# Patient Record
Sex: Female | Born: 1990 | Race: Black or African American | Hispanic: No | Marital: Single | Smoking: Never smoker
Health system: Southern US, Community
[De-identification: ages and names within clinical notes are randomized; demographics above are authoritative.]

## PROBLEM LIST (undated history)

## (undated) DIAGNOSIS — F419 Anxiety disorder, unspecified: Secondary | ICD-10-CM

## (undated) DIAGNOSIS — J45909 Unspecified asthma, uncomplicated: Secondary | ICD-10-CM

## (undated) HISTORY — PX: DILATION AND CURETTAGE OF UTERUS: SHX78

## (undated) HISTORY — DX: Anxiety disorder, unspecified: F41.9

---

## 2015-09-17 ENCOUNTER — Emergency Department (HOSPITAL_COMMUNITY): Payer: Medicaid Other

## 2015-09-17 ENCOUNTER — Encounter (HOSPITAL_COMMUNITY): Payer: Self-pay

## 2015-09-17 ENCOUNTER — Emergency Department (HOSPITAL_COMMUNITY)
Admission: EM | Admit: 2015-09-17 | Discharge: 2015-09-17 | Disposition: A | Discharge status: Home / Self Care | Payer: Medicaid Other | Attending: Emergency Medicine | Admitting: Emergency Medicine

## 2015-09-17 DIAGNOSIS — J4521 Mild intermittent asthma with (acute) exacerbation: Secondary | ICD-10-CM | POA: Diagnosis not present

## 2015-09-17 DIAGNOSIS — H6591 Unspecified nonsuppurative otitis media, right ear: Secondary | ICD-10-CM | POA: Insufficient documentation

## 2015-09-17 DIAGNOSIS — R05 Cough: Secondary | ICD-10-CM | POA: Diagnosis present

## 2015-09-17 HISTORY — DX: Unspecified asthma, uncomplicated: J45.909

## 2015-09-17 MED ORDER — AEROCHAMBER PLUS W/MASK MISC
1.0000 | Freq: Once | Status: AC
  Administered 2015-09-17: 1
  Filled 2015-09-17: qty 1

## 2015-09-17 MED ORDER — AMOXICILLIN 500 MG PO CAPS: 500.0000 mg | ORAL_CAPSULE | Freq: Three times a day (TID) | ORAL | Status: DC

## 2015-09-17 MED ORDER — PREDNISONE 20 MG PO TABS: 40.0000 mg | ORAL_TABLET | Freq: Every day | ORAL | Status: DC

## 2015-09-17 MED ORDER — ALBUTEROL SULFATE HFA 108 (90 BASE) MCG/ACT IN AERS
2.0000 | INHALATION_SPRAY | Freq: Once | RESPIRATORY_TRACT | Status: AC
  Administered 2015-09-17: 2 via RESPIRATORY_TRACT
  Filled 2015-09-17: qty 6.7

## 2015-09-17 NOTE — ED Provider Notes (Signed)
CSN: [id number]     Arrival date & time 09/17/15  1052 History  By signing my name below, I, Freida Busman, attest that this documentation has been prepared under the direction and in the presence of non-physician practitioner, Arthor Captain, PA-C. Electronically Signed: Freida Busman, Scribe. 09/17/2015. 2:04 PM.      Chief Complaint  Patient presents with  . Cough    The history is provided by the patient. No language interpreter was used.     HPI Comments:  Emma Walsh is a 25 y.o. female  who presents to the Emergency Department complaining of cough x ~ 1 month. She notes the cough has been intermittent with  occasional yellow or green sputum production.Her cough is worse at night. She reports associated ear fullness, chills, and sore throat/tightness in her throat after coughing episodes that resolves on its own. Pt has sick contacts at home with similar symptoms- her children. She denies nausea vomiting and diarrhea. No alleviating factors noted. Pt not a smoker.  No PCP  Past Medical History  Diagnosis Date  . Asthma    History reviewed. No pertinent past surgical history. History reviewed. No pertinent family history. Social History  Substance Use Topics  . Smoking status: Never Smoker   . Smokeless tobacco: None  . Alcohol Use: Yes     Comment: social   OB History    No data available     Review of Systems  Constitutional: Positive for chills.  HENT: Positive for ear pain (fullness) and sore throat.   Respiratory: Positive for cough.   Gastrointestinal: Negative for nausea and vomiting.    Allergies  Review of patient's allergies indicates no known allergies.  Home Medications   Prior to Admission medications   Medication Sig Start Date End Date Taking? Authorizing Provider  amoxicillin (AMOXIL) 500 MG capsule Take 1 capsule (500 mg total) by mouth 3 (three) times daily. 09/17/15   Arthor Captain, PA-C  predniSONE (DELTASONE) 20 MG tablet Take 2 tablets (40  mg total) by mouth daily. 09/17/15   Kristoffer Bala, PA-C   BP 131/89 mmHg  Pulse 76  Temp(Src) 98.6 F (37 C) (Oral)  Resp 16  SpO2 98%  LMP 09/13/2015 Physical Exam  Constitutional: She is oriented to person, place, and time. She appears well-developed and well-nourished. No distress.  HENT:  Head: Normocephalic and atraumatic.  Right Ear: Tympanic membrane is bulging (with air fluids).  Left Ear: Tympanic membrane normal.  Mouth/Throat: Oropharynx is clear and moist.  Eyes: Conjunctivae are normal.  Neck:  Tonsillar adenopathy on exam  Cardiovascular: Normal rate, regular rhythm and normal heart sounds.   Pulmonary/Chest: Effort normal and breath sounds normal. No respiratory distress.  lungs clear with dry cough  Abdominal: She exhibits no distension.  Lymphadenopathy:       Head (right side): Tonsillar adenopathy present.       Head (left side): Tonsillar adenopathy present.    She has no cervical adenopathy.  Neurological: She is alert and oriented to person, place, and time.  Skin: Skin is warm and dry.  Psychiatric: She has a normal mood and affect.  Nursing note and vitals reviewed.   ED Course  Procedures   DIAGNOSTIC STUDIES:  Oxygen Saturation is 98% on RA, normal by my interpretation.    COORDINATION OF CARE:  1:49 PM Discussed treatment plan with pt at bedside and pt agreed to plan.  Imaging Review Dg Chest 2 View  09/17/2015  CLINICAL DATA:  Cough for  1 month. EXAM: CHEST  2 VIEW COMPARISON:  None. FINDINGS: Lungs are clear. Heart size and pulmonary vascularity are normal. No adenopathy. No bone lesions. IMPRESSION: No edema or consolidation. Electronically Signed   By: Bretta Bang III M.D.   On: 09/17/2015 12:27   I have personally reviewed and evaluated these images as part of my medical decision-making.    MDM   Final diagnoses:  RAD (reactive airway disease), mild intermittent, with acute exacerbation  OME (otitis media with effusion),  right      Pt symptoms consistent with URI. CXR negative for acute infiltrate. Patient also presents with otalgia and exam consistent with acute otitis media. No concern for acute mastoiditis, meningitis. Pt will be discharged with symptomatic treatment..  Discussed return precautions.   Patient expresses understanding and agrees with plan. Pt is hemodynamically stable & in NAD prior to discharge.  I personally performed the services described in this documentation, which was scribed in my presence. The recorded information has been reviewed and is accurate.       Arthor Captain, PA-C 09/17/15 2043  Gerhard Munch, MD 09/18/15 [phone]

## 2015-09-17 NOTE — Discharge Instructions (Signed)
Cough, Adult Coughing is a reflex that clears your throat and your airways. Coughing helps to heal and protect your lungs. It is normal to cough occasionally, but a cough that happens with other symptoms or lasts a long time may be a sign of a condition that needs treatment. A cough may last only 2-3 weeks (acute), or it may last longer than 8 weeks (chronic). CAUSES Coughing is commonly caused by: 1. Breathing in substances that irritate your lungs. 2. A viral or bacterial respiratory infection. 3. Allergies. 4. Asthma. 5. Postnasal drip. 6. Smoking. 7. Acid backing up from the stomach into the esophagus (gastroesophageal reflux). 8. Certain medicines. 9. Chronic lung problems, including COPD (or rarely, lung cancer). 10. Other medical conditions such as heart failure. HOME CARE INSTRUCTIONS  Pay attention to any changes in your symptoms. Take these actions to help with your discomfort:  Take medicines only as told by your health care provider.  If you were prescribed an antibiotic medicine, take it as told by your health care provider. Do not stop taking the antibiotic even if you start to feel better.  Talk with your health care provider before you take a cough suppressant medicine.  Drink enough fluid to keep your urine clear or pale yellow.  If the air is dry, use a cold steam vaporizer or humidifier in your bedroom or your home to help loosen secretions.  Avoid anything that causes you to cough at work or at home.  If your cough is worse at night, try sleeping in a semi-upright position.  Avoid cigarette smoke. If you smoke, quit smoking. If you need help quitting, ask your health care provider.  Avoid caffeine.  Avoid alcohol.  Rest as needed. SEEK MEDICAL CARE IF:   You have new symptoms.  You cough up pus.  Your cough does not get better after 2-3 weeks, or your cough gets worse.  You cannot control your cough with suppressant medicines and you are losing  sleep.  You develop pain that is getting worse or pain that is not controlled with pain medicines.  You have a fever.  You have unexplained weight loss.  You have night sweats. SEEK IMMEDIATE MEDICAL CARE IF:  You cough up blood.  You have difficulty breathing.  Your heartbeat is very fast.   This information is not intended to replace advice given to you by your health care provider. Make sure you discuss any questions you have with your health care provider.   Document Released: 01/15/2011 Document Revised: 04/09/2015 Document Reviewed: 09/25/2014 Elsevier Interactive Patient Education 2016 [employer].  How to Use an Inhaler Proper inhaler technique is very important. Good technique ensures that the medicine reaches the lungs. Poor technique results in depositing the medicine on the tongue and back of the throat rather than in the airways. If you do not use the inhaler with good technique, the medicine will not help you. STEPS TO FOLLOW IF USING AN INHALER WITHOUT AN EXTENSION TUBE 11. Remove the cap from the inhaler. 12. If you are using the inhaler for the first time, you will need to prime it. Shake the inhaler for 5 seconds and release four puffs into the air, away from your face. Ask your health care provider or pharmacist if you have questions about priming your inhaler. 13. Shake the inhaler for 5 seconds before each breath in (inhalation). 14. Position the inhaler so that the top of the canister faces up. 15. Put your index finger on the  top of the medicine canister. Your thumb supports the bottom of the inhaler. 16. Open your mouth. 17. Either place the inhaler between your teeth and place your lips tightly around the mouthpiece, or hold the inhaler 1-2 inches away from your open mouth. If you are unsure of which technique to use, ask your health care provider. 18. Breathe out (exhale) normally and as completely as possible. 19. Press the canister down with your index  finger to release the medicine. 20. At the same time as the canister is pressed, inhale deeply and slowly until your lungs are completely filled. This should take 4-6 seconds. Keep your tongue down. 21. Hold the medicine in your lungs for 5-10 seconds (10 seconds is best). This helps the medicine get into the small airways of your lungs. 22. Breathe out slowly, through pursed lips. Whistling is an example of pursed lips. 23. Wait at least 15-30 seconds between puffs. Continue with the above steps until you have taken the number of puffs your health care provider has ordered. Do not use the inhaler more than your health care provider tells you. 24. Replace the cap on the inhaler. 25. Follow the directions from your health care provider or the inhaler insert for cleaning the inhaler. STEPS TO FOLLOW IF USING AN INHALER WITH AN EXTENSION (SPACER)  Remove the cap from the inhaler.  If you are using the inhaler for the first time, you will need to prime it. Shake the inhaler for 5 seconds and release four puffs into the air, away from your face. Ask your health care provider or pharmacist if you have questions about priming your inhaler.  Shake the inhaler for 5 seconds before each breath in (inhalation).  Place the open end of the spacer onto the mouthpiece of the inhaler.  Position the inhaler so that the top of the canister faces up and the spacer mouthpiece faces you.  Put your index finger on the top of the medicine canister. Your thumb supports the bottom of the inhaler and the spacer.  Breathe out (exhale) normally and as completely as possible.  Immediately after exhaling, place the spacer between your teeth and into your mouth. Close your lips tightly around the spacer.  Press the canister down with your index finger to release the medicine.  At the same time as the canister is pressed, inhale deeply and slowly until your lungs are completely filled. This should take 4-6 seconds. Keep  your tongue down and out of the way.  Hold the medicine in your lungs for 5-10 seconds (10 seconds is best). This helps the medicine get into the small airways of your lungs. Exhale.  Repeat inhaling deeply through the spacer mouthpiece. Again hold that breath for up to 10 seconds (10 seconds is best). Exhale slowly. If it is difficult to take this second deep breath through the spacer, breathe normally several times through the spacer. Remove the spacer from your mouth.  Wait at least 15-30 seconds between puffs. Continue with the above steps until you have taken the number of puffs your health care provider has ordered. Do not use the inhaler more than your health care provider tells you.  Remove the spacer from the inhaler, and place the cap on the inhaler.  Follow the directions from your health care provider or the inhaler insert for cleaning the inhaler and spacer. If you are using different kinds of inhalers, use your quick relief medicine to open the airways 10-15 minutes before using a  steroid if instructed to do so by your health care provider. If you are unsure which inhalers to use and the order of using them, ask your health care provider, nurse, or respiratory therapist. If you are using a steroid inhaler, always rinse your mouth with water after your last puff, then gargle and spit out the water. Do not swallow the water. AVOID:  Inhaling before or after starting the spray of medicine. It takes practice to coordinate your breathing with triggering the spray.  Inhaling through the nose (rather than the mouth) when triggering the spray. HOW TO DETERMINE IF YOUR INHALER IS FULL OR NEARLY EMPTY You cannot know when an inhaler is empty by shaking it. A few inhalers are now being made with dose counters. Ask your health care provider for a prescription that has a dose counter if you feel you need that extra help. If your inhaler does not have a counter, ask your health care provider to  help you determine the date you need to refill your inhaler. Write the refill date on a calendar or your inhaler canister. Refill your inhaler 7-10 days before it runs out. Be sure to keep an adequate supply of medicine. This includes making sure it is not expired, and that you have a spare inhaler.  SEEK MEDICAL CARE IF:   Your symptoms are only partially relieved with your inhaler.  You are having trouble using your inhaler.  You have some increase in phlegm. SEEK IMMEDIATE MEDICAL CARE IF:   You feel little or no relief with your inhalers. You are still wheezing and are feeling shortness of breath or tightness in your chest or both.  You have dizziness, headaches, or a fast heart rate.  You have chills, fever, or night sweats.  You have a noticeable increase in phlegm production, or there is blood in the phlegm. MAKE SURE YOU:   Understand these instructions.  Will watch your condition.  Will get help right away if you are not doing well or get worse.   This information is not intended to replace advice given to you by your health care provider. Make sure you discuss any questions you have with your health care provider.   Document Released: 07/16/2000 Document Revised: 05/09/2013 Document Reviewed: 02/15/2013 Elsevier Interactive Patient Education 2016 [employer] Inc[employer]  Metered Dose Inhaler (No Spacer Used) Inhaled medicines are the basis of treatment for asthma and other breathing problems. Inhaled medicine can only be effective if used properly. Good technique assures that the medicine reaches the lungs. Metered dose inhalers (MDIs) are used to deliver a variety of inhaled medicines. These include quick relief or rescue medicines (such as bronchodilators) and controller medicines (such as corticosteroids). The medicine is delivered by pushing down on a metal canister to release a set amount of spray. If you are using different kinds of inhalers, use your quick relief medicine to  open the airways 10-15 minutes before using a steroid, if instructed to do so by your health care provider. If you are unsure which inhalers to use and the order of using them, ask your health care provider, nurse, or respiratory therapist. HOW TO USE THE INHALER 26. Remove the cap from the inhaler. 27. If you are using the inhaler for the first time, you will need to prime it. Shake the inhaler for 5 seconds and release four puffs into the air, away from your face. Ask your health care provider or pharmacist if you have questions about priming your inhaler. 28.  Shake the inhaler for 5 seconds before each breath in (inhalation). 29. Position the inhaler so that the top of the canister faces up. 30. Put your index finger on the top of the medicine canister. Your thumb supports the bottom of the inhaler. 31. Open your mouth. 32. Either place the inhaler between your teeth and place your lips tightly around the mouthpiece, or hold the inhaler 1-2 inches away from your open mouth. If you are unsure of which technique to use, ask your health care provider. 33. Breathe out (exhale) normally and as completely as possible. 34. Press the canister down with the index finger to release the medicine. 35. At the same time as the canister is pressed, inhale deeply and slowly until your lungs are completely filled. This should take 4-6 seconds. Keep your tongue down. 36. Hold the medicine in your lungs for 5-10 seconds (10 seconds is best). This helps the medicine get into the small airways of your lungs. 37. Breathe out slowly, through pursed lips. Whistling is an example of pursed lips. 38. Wait at least 1 minute between puffs. Continue with the above steps until you have taken the number of puffs your health care provider has ordered. Do not use the inhaler more than your health care provider directs you to. 39. Replace the cap on the inhaler. 40. Follow the directions from your health care provider or the  inhaler insert for cleaning the inhaler. If you are using a steroid inhaler, after your last puff, rinse your mouth with water, gargle, and spit out the water. Do not swallow the water. AVOID:  Inhaling before or after starting the spray of medicine. It takes practice to coordinate your breathing with triggering the spray.  Inhaling through the nose (rather than the mouth) when triggering the spray. HOW TO DETERMINE IF YOUR INHALER IS FULL OR NEARLY EMPTY You cannot know when an inhaler is empty by shaking it. Some inhalers are now being made with dose counters. Ask your health care provider for a prescription that has a dose counter if you feel you need that extra help. If your inhaler does not have a counter, ask your health care provider to help you determine the date you need to refill your inhaler. Write the refill date on a calendar or your inhaler canister. Refill your inhaler 7-10 days before it runs out. Be sure to keep an adequate supply of medicine. This includes making sure it has not expired, and making sure you have a spare inhaler. SEEK MEDICAL CARE IF:  Symptoms are only partially relieved with your inhaler.  You are having trouble using your inhaler.  You experience an increase in phlegm. SEEK IMMEDIATE MEDICAL CARE IF:  You feel little or no relief with your inhalers. You are still wheezing and feeling shortness of breath, tightness in your chest, or both.  You have dizziness, headaches, or a fast heart rate.  You have chills, fever, or night sweats.  There is a noticeable increase in phlegm production, or there is blood in the phlegm. MAKE SURE YOU:  Understand these instructions.  Will watch your condition.  Will get help right away if you are not doing well or get worse.   This information is not intended to replace advice given to you by your health care provider. Make sure you discuss any questions you have with your health care provider.   Document Released:  05/16/2007 Document Revised: 08/09/2014 Document Reviewed: 01/04/2013 Elsevier Interactive Patient Education [employer]

## 2015-09-17 NOTE — ED Notes (Signed)
Pt with dry cough x 1 month.  Unknown for fever.  Pt states first morning cough is slightly productive but dries throughout the day.

## 2015-11-02 ENCOUNTER — Emergency Department (HOSPITAL_COMMUNITY): Payer: Medicaid Other

## 2015-11-02 ENCOUNTER — Emergency Department (HOSPITAL_COMMUNITY)
Admission: EM | Admit: 2015-11-02 | Discharge: 2015-11-03 | Disposition: A | Discharge status: Home / Self Care | Payer: Medicaid Other | Attending: Emergency Medicine | Admitting: Emergency Medicine

## 2015-11-02 ENCOUNTER — Encounter (HOSPITAL_COMMUNITY): Payer: Self-pay | Admitting: Emergency Medicine

## 2015-11-02 DIAGNOSIS — Y9389 Activity, other specified: Secondary | ICD-10-CM | POA: Diagnosis not present

## 2015-11-02 DIAGNOSIS — Z7952 Long term (current) use of systemic steroids: Secondary | ICD-10-CM | POA: Diagnosis not present

## 2015-11-02 DIAGNOSIS — Z3202 Encounter for pregnancy test, result negative: Secondary | ICD-10-CM | POA: Insufficient documentation

## 2015-11-02 DIAGNOSIS — J45909 Unspecified asthma, uncomplicated: Secondary | ICD-10-CM | POA: Diagnosis not present

## 2015-11-02 DIAGNOSIS — Y92410 Unspecified street and highway as the place of occurrence of the external cause: Secondary | ICD-10-CM | POA: Insufficient documentation

## 2015-11-02 DIAGNOSIS — Y998 Other external cause status: Secondary | ICD-10-CM | POA: Insufficient documentation

## 2015-11-02 DIAGNOSIS — S199XXA Unspecified injury of neck, initial encounter: Secondary | ICD-10-CM | POA: Diagnosis present

## 2015-11-02 DIAGNOSIS — S161XXA Strain of muscle, fascia and tendon at neck level, initial encounter: Secondary | ICD-10-CM | POA: Diagnosis not present

## 2015-11-02 DIAGNOSIS — Z792 Long term (current) use of antibiotics: Secondary | ICD-10-CM | POA: Insufficient documentation

## 2015-11-02 DIAGNOSIS — S39012A Strain of muscle, fascia and tendon of lower back, initial encounter: Secondary | ICD-10-CM | POA: Insufficient documentation

## 2015-11-02 LAB — POC URINE PREG, ED: PREG TEST UR: NEGATIVE

## 2015-11-02 MED ORDER — OXYCODONE-ACETAMINOPHEN 5-325 MG PO TABS
1.0000 | ORAL_TABLET | ORAL | Status: DC | PRN
  Administered 2015-11-02: 1 via ORAL
  Filled 2015-11-02: qty 1
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
## 2015-11-02 NOTE — ED Notes (Signed)
11/02/2015 Pain medication given in Triage. Patient advised about side effects of medications and  to avoid driving for a minimum of 4 hours.
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
## 2015-11-03 MED ORDER — TRAMADOL HCL 50 MG PO TABS: 50.0000 mg | ORAL_TABLET | Freq: Four times a day (QID) | ORAL | Status: DC | PRN

## 2015-11-03 MED ORDER — IBUPROFEN 800 MG PO TABS: 800.0000 mg | ORAL_TABLET | Freq: Three times a day (TID) | ORAL | Status: DC | PRN

## 2015-11-03 NOTE — Discharge Instructions (Signed)
Return here as needed.  Use ice and heat on your neck and back.  The x-rays did not show any abnormalities

## 2016-05-14 ENCOUNTER — Emergency Department (HOSPITAL_COMMUNITY)
Admission: EM | Admit: 2016-05-14 | Discharge: 2016-05-14 | Disposition: A | Discharge status: Home / Self Care | Payer: Medicaid Other | Attending: Emergency Medicine | Admitting: Emergency Medicine

## 2016-05-14 ENCOUNTER — Encounter (HOSPITAL_COMMUNITY): Payer: Self-pay | Admitting: Emergency Medicine

## 2016-05-14 ENCOUNTER — Emergency Department (HOSPITAL_COMMUNITY): Payer: Self-pay

## 2016-05-14 DIAGNOSIS — J40 Bronchitis, not specified as acute or chronic: Secondary | ICD-10-CM | POA: Insufficient documentation

## 2016-05-14 DIAGNOSIS — Z791 Long term (current) use of non-steroidal anti-inflammatories (NSAID): Secondary | ICD-10-CM | POA: Insufficient documentation

## 2016-05-14 DIAGNOSIS — J069 Acute upper respiratory infection, unspecified: Secondary | ICD-10-CM

## 2016-05-14 MED ORDER — ALBUTEROL SULFATE HFA 108 (90 BASE) MCG/ACT IN AERS: 1.0000 | INHALATION_SPRAY | RESPIRATORY_TRACT | 0 refills | Status: DC | PRN

## 2016-05-14 MED ORDER — ALBUTEROL SULFATE (2.5 MG/3ML) 0.083% IN NEBU
2.5000 mg | INHALATION_SOLUTION | Freq: Once | RESPIRATORY_TRACT | Status: AC
  Administered 2016-05-14: 2.5 mg via RESPIRATORY_TRACT
  Filled 2016-05-14: qty 3

## 2016-05-14 MED ORDER — PREDNISONE 20 MG PO TABS: 40.0000 mg | ORAL_TABLET | Freq: Every day | ORAL | 0 refills | Status: DC

## 2016-05-14 MED ORDER — PREDNISONE 20 MG PO TABS
60.0000 mg | ORAL_TABLET | Freq: Once | ORAL | Status: AC
  Administered 2016-05-14: 60 mg via ORAL
  Filled 2016-05-14: qty 3

## 2016-05-14 MED ORDER — PROMETHAZINE-DM 6.25-15 MG/5ML PO SYRP: 5.0000 mL | ORAL_SOLUTION | Freq: Four times a day (QID) | ORAL | 0 refills | Status: DC | PRN

## 2016-05-14 MED ORDER — AZITHROMYCIN 250 MG PO TABS: ORAL_TABLET | ORAL | 0 refills | Status: DC

## 2016-05-14 MED ORDER — IPRATROPIUM-ALBUTEROL 0.5-2.5 (3) MG/3ML IN SOLN
3.0000 mL | Freq: Once | RESPIRATORY_TRACT | Status: AC
  Administered 2016-05-14: 3 mL via RESPIRATORY_TRACT
  Filled 2016-05-14: qty 3

## 2016-05-14 NOTE — Progress Notes (Addendum)
Pt has been coughing times several days. Breathing treatment given.pt taken to the x-ray dept.

## 2016-05-14 NOTE — Discharge Instructions (Signed)
Take medication as prescribed. Drink plenty of water to stay hydrated. Return to the ER for new or worsening symptoms.

## 2016-05-14 NOTE — ED Provider Notes (Signed)
[facility]-EMERGENCY DEPT Provider Note   CSN: [id number] Arrival date & time: 05/14/16  1033  By signing my name below, I, Clovis Pu, attest that this documentation has been prepared under the direction and in the presence of  Mariachristina Holle, [place]. Electronically Signed: Clovis Pu, ED Scribe. 05/14/16. 3:02 PM.   History   Chief Complaint Chief Complaint  Patient presents with  . URI    The history is provided by the patient. No language interpreter was used.   HPI Comments:  Emma Walsh is a 25 y.o. female, with a hx of asthmatic bronchitis, who presents to the Emergency Department complaining of cold symptoms x 2 weeks. She notes associated occasional blood tinged mucous, rhinorrhea, sore throat, congestion, chills. Pt also states she has swelling to the R side of her neck.  Pt has taken mucinex cough and inhaler three times a day with no relief. She denies difficulty swallowing and fevers. Denies night sweats or unexpected weight loss.  Past Medical History:  Diagnosis Date  . Asthma     There are no active problems to display for this patient.   History reviewed. No pertinent surgical history.  OB History    No data available       Home Medications    Prior to Admission medications   Medication Sig Start Date End Date Taking? Authorizing Provider  amoxicillin (AMOXIL) 500 MG capsule Take 1 capsule (500 mg total) by mouth 3 (three) times daily. 09/17/15   Arthor Captain, PA-C  ibuprofen (ADVIL,MOTRIN) 800 MG tablet Take 1 tablet (800 mg total) by mouth every 8 (eight) hours as needed. 11/03/15   Charlestine Night, PA-C  predniSONE (DELTASONE) 20 MG tablet Take 2 tablets (40 mg total) by mouth daily. 09/17/15   Arthor Captain, PA-C  traMADol (ULTRAM) 50 MG tablet Take 1 tablet (50 mg total) by mouth every 6 (six) hours as needed for severe pain. 11/03/15   Charlestine Night, PA-C    Family History No family history on file.  Social History Social History  Substance Use  Topics  . Smoking status: Never Smoker  . Smokeless tobacco: Never Used  . Alcohol use Yes     Comment: social     Allergies   Review of patient's allergies indicates no known allergies.   Review of Systems Review of Systems  Constitutional: Positive for chills. Negative for fever.  HENT: Positive for congestion, rhinorrhea and sore throat. Negative for trouble swallowing.   Respiratory: Positive for cough.    10 Systems reviewed and are negative for acute change except as noted in the HPI.  Physical Exam Updated Vital Signs LMP 05/03/2016   Physical Exam  Constitutional: She is oriented to person, place, and time. She appears well-developed and well-nourished. No distress.  HENT:  Head: Normocephalic and atraumatic.  Right Ear: External ear normal.  Left Ear: External ear normal.  Nose: Nose normal.  TM NL bilaterally  Eyes: Conjunctivae are normal.  Neck:  Right anterior cervical lymphadenopathy   Cardiovascular: Normal rate and normal heart sounds.   Pulmonary/Chest: Effort normal. She has wheezes. She has no rales.  Soft expiratory wheezing in all lung fields. No rhonchi.   Abdominal: She exhibits no distension.  Neurological: She is alert and oriented to person, place, and time.  Skin: Skin is warm and dry.  Psychiatric: She has a normal mood and affect.  Nursing note and vitals reviewed.    ED Treatments / Results  DIAGNOSTIC STUDIES:  Oxygen Saturation is 100%  on RA, normal by my interpretation.    COORDINATION OF CARE:  1:19 PM Discussed treatment plan with pt at bedside and pt agreed to plan.  Labs (all labs ordered are listed, but only abnormal results are displayed) Labs Reviewed - No data to display  EKG  EKG Interpretation None       Radiology Dg Chest 2 View  Result Date: 05/14/2016 CLINICAL DATA:  Cough, 3 weeks duration.  History of bronchitis. EXAM: CHEST  2 VIEW COMPARISON:  09/17/2015 FINDINGS: Heart size is normal. Mediastinal  shadows are normal. The lungs are clear. No bronchial thickening. No infiltrate, mass, effusion or collapse. Pulmonary vascularity is normal. No bony abnormality. IMPRESSION: Normal chest. Electronically Signed   By: Paulina FusiMark  Shogry M.D.   On: 05/14/2016 14:05    Procedures Procedures (including critical care time)  Medications Ordered in ED Medications - No data to display   Initial Impression / Assessment and Plan / ED Course  I have reviewed the triage vital signs and the nursing notes.  Pertinent labs & imaging results that were available during my care of the patient were reviewed by me and considered in my medical decision making (see chart for details).  Clinical Course    Pt symptoms consistent with URI. CXR negative for acute infiltrate. Lungs CTAB after duoneb. Her cervical LAD is likely related to URI. No supraclavicular lymphadenopathy, night sweats, unexplained weight loss. Pt will be discharged with symptomatic treatment. As she is between insurance and primary care providers I did provide rx for z-pack for her to take if symptoms persist after the weekend. Discussed return precautions.  Pt is hemodynamically stable & in NAD prior to discharge.   Final Clinical Impressions(s) / ED Diagnoses   Final diagnoses:  Upper respiratory tract infection, unspecified type  Bronchitis    New Prescriptions Discharge Medication List as of 05/14/2016  3:13 PM    START taking these medications   Details  albuterol (PROVENTIL HFA;VENTOLIN HFA) 108 (90 Base) MCG/ACT inhaler Inhale 1-2 puffs into the lungs every 4 (four) hours as needed for wheezing or shortness of breath., Starting Fri 05/14/2016, Print    azithromycin (ZITHROMAX) 250 MG tablet Take first 2 tablets together, then 1 every day until finished., Print    !! predniSONE (DELTASONE) 20 MG tablet Take 2 tablets (40 mg total) by mouth daily., Starting Fri 05/14/2016, Print    promethazine-dextromethorphan (PROMETHAZINE-DM)  6.25-15 MG/5ML syrup Take 5 mLs by mouth 4 (four) times daily as needed for cough., Starting Fri 05/14/2016, Print     !! - Potential duplicate medications found. Please discuss with provider.    I personally performed the services described in this documentation, which was scribed in my presence. The recorded information has been reviewed and is accurate.    Carlene CoriaSerena Y Kenith Trickel, PA-C 05/14/16 1523    Nira ConnPedro Eduardo Cardama, MD 05/14/16 [phone]

## 2016-05-14 NOTE — ED Triage Notes (Signed)
Per pt, states cold symptoms for 2 weeks-cough, chest congestion-mucus yellow, and sometimes blood tinged

## 2016-05-26 LAB — OB RESULTS CONSOLE GBS: GBS: POSITIVE

## 2016-08-02 NOTE — L&D Delivery Note (Signed)
Delivery Note Pt received epidural but did not get much relief as she progressed quickly to complete and pushed well.  At 1:32 PM a viable female was delivered via  (Presentation: vtx; ROA ).  APGAR: 8, 9; weight pending.   Placenta status: spontaneous, intact.  Cord:  with the following complications: none.   Anesthesia: Epidural  Episiotomy: None Lacerations: Labial Suture Repair: none Est. Blood Loss (mL):  200  Mom to postpartum.  Baby to Couplet care / Skin to Skin.  She does not want the baby circumcised.  Leighton Roachodd D Naseer Hearn 06/21/2017, 1:44 PM

## 2016-12-28 LAB — OB RESULTS CONSOLE HEPATITIS B SURFACE ANTIGEN: Hepatitis B Surface Ag: NEGATIVE

## 2016-12-28 LAB — OB RESULTS CONSOLE RUBELLA ANTIBODY, IGM: Rubella: IMMUNE

## 2016-12-28 LAB — OB RESULTS CONSOLE ABO/RH: RH Type: POSITIVE

## 2016-12-28 LAB — OB RESULTS CONSOLE GC/CHLAMYDIA
Chlamydia: NEGATIVE
Gonorrhea: NEGATIVE

## 2016-12-28 LAB — OB RESULTS CONSOLE ANTIBODY SCREEN: Antibody Screen: NEGATIVE

## 2016-12-28 LAB — OB RESULTS CONSOLE HIV ANTIBODY (ROUTINE TESTING): HIV: NONREACTIVE

## 2016-12-28 LAB — OB RESULTS CONSOLE RPR: RPR: NONREACTIVE

## 2017-05-26 LAB — OB RESULTS CONSOLE GBS: STREP GROUP B AG: POSITIVE

## 2017-06-14 ENCOUNTER — Telehealth (HOSPITAL_COMMUNITY): Payer: Self-pay | Admitting: *Deleted

## 2017-06-14 ENCOUNTER — Encounter (HOSPITAL_COMMUNITY): Payer: Self-pay | Admitting: *Deleted

## 2017-06-14 NOTE — Telephone Encounter (Signed)
Preadmission screen  

## 2017-06-21 ENCOUNTER — Inpatient Hospital Stay (HOSPITAL_COMMUNITY)
Admission: RE | Admit: 2017-06-21 | Discharge: 2017-06-22 | DRG: 807 | Disposition: A | Discharge status: Home / Self Care | Payer: Medicaid Other | Source: Ambulatory Visit | Attending: Obstetrics and Gynecology | Admitting: Obstetrics and Gynecology

## 2017-06-21 ENCOUNTER — Inpatient Hospital Stay (HOSPITAL_COMMUNITY): Payer: Medicaid Other | Admitting: Anesthesiology

## 2017-06-21 ENCOUNTER — Encounter (HOSPITAL_COMMUNITY): Payer: Self-pay | Admitting: Anesthesiology

## 2017-06-21 ENCOUNTER — Encounter (HOSPITAL_COMMUNITY): Payer: Self-pay

## 2017-06-21 DIAGNOSIS — O99824 Streptococcus B carrier state complicating childbirth: Secondary | ICD-10-CM | POA: Diagnosis present

## 2017-06-21 DIAGNOSIS — Z3A40 40 weeks gestation of pregnancy: Secondary | ICD-10-CM

## 2017-06-21 DIAGNOSIS — O26893 Other specified pregnancy related conditions, third trimester: Secondary | ICD-10-CM | POA: Diagnosis present

## 2017-06-21 LAB — CBC
HCT: 35.7 % — ABNORMAL LOW (ref 36.0–46.0)
Hemoglobin: 11.7 g/dL — ABNORMAL LOW (ref 12.0–15.0)
MCH: 30.4 pg (ref 26.0–34.0)
MCHC: 32.8 g/dL (ref 30.0–36.0)
MCV: 92.7 fL (ref 78.0–100.0)
PLATELETS: 201 10*3/uL (ref 150–400)
RBC: 3.85 MIL/uL — AB (ref 3.87–5.11)
RDW: 14.2 % (ref 11.5–15.5)
WBC: 10.4 10*3/uL (ref 4.0–10.5)

## 2017-06-21 LAB — TYPE AND SCREEN
ABO/RH(D): O POS
Antibody Screen: NEGATIVE

## 2017-06-21 LAB — ABO/RH: ABO/RH(D): O POS

## 2017-06-21 MED ORDER — SIMETHICONE 80 MG PO CHEW: 80.0000 mg | CHEWABLE_TABLET | ORAL | Status: DC | PRN

## 2017-06-21 MED ORDER — DIPHENHYDRAMINE HCL 25 MG PO CAPS: 25.0000 mg | ORAL_CAPSULE | Freq: Four times a day (QID) | ORAL | Status: DC | PRN

## 2017-06-21 MED ORDER — LIDOCAINE HCL (PF) 1 % IJ SOLN
30.0000 mL | INTRAMUSCULAR | Status: AC | PRN
  Administered 2017-06-21: 7 mL via SUBCUTANEOUS
  Administered 2017-06-21: 5 mL via SUBCUTANEOUS

## 2017-06-21 MED ORDER — OXYCODONE-ACETAMINOPHEN 5-325 MG PO TABS
2.0000 | ORAL_TABLET | ORAL | Status: DC | PRN
Start: 2017-06-21 — End: 2017-06-21

## 2017-06-21 MED ORDER — PHENYLEPHRINE 40 MCG/ML (10ML) SYRINGE FOR IV PUSH (FOR BLOOD PRESSURE SUPPORT)
80.0000 ug | PREFILLED_SYRINGE | INTRAVENOUS | Status: DC | PRN
  Filled 2017-06-21: qty 5

## 2017-06-21 MED ORDER — COCONUT OIL OIL: 1.0000 "application " | TOPICAL_OIL | Status: DC | PRN

## 2017-06-21 MED ORDER — ONDANSETRON HCL 4 MG/2ML IJ SOLN: 4.0000 mg | Freq: Four times a day (QID) | INTRAMUSCULAR | Status: DC | PRN

## 2017-06-21 MED ORDER — OXYCODONE HCL 5 MG PO TABS: 10.0000 mg | ORAL_TABLET | ORAL | Status: DC | PRN

## 2017-06-21 MED ORDER — WITCH HAZEL-GLYCERIN EX PADS: 1.0000 "application " | MEDICATED_PAD | CUTANEOUS | Status: DC | PRN

## 2017-06-21 MED ORDER — LACTATED RINGERS IV SOLN: 500.0000 mL | INTRAVENOUS | Status: DC | PRN

## 2017-06-21 MED ORDER — BUTORPHANOL TARTRATE 1 MG/ML IJ SOLN: 1.0000 mg | INTRAMUSCULAR | Status: DC | PRN

## 2017-06-21 MED ORDER — BENZOCAINE-MENTHOL 20-0.5 % EX AERO: 1.0000 "application " | INHALATION_SPRAY | CUTANEOUS | Status: DC | PRN

## 2017-06-21 MED ORDER — MEASLES, MUMPS & RUBELLA VAC ~~LOC~~ INJ
0.5000 mL | INJECTION | Freq: Once | SUBCUTANEOUS | Status: DC
  Filled 2017-06-21: qty 0.5

## 2017-06-21 MED ORDER — ONDANSETRON HCL 4 MG PO TABS: 4.0000 mg | ORAL_TABLET | ORAL | Status: DC | PRN

## 2017-06-21 MED ORDER — PHENYLEPHRINE 40 MCG/ML (10ML) SYRINGE FOR IV PUSH (FOR BLOOD PRESSURE SUPPORT)
80.0000 ug | PREFILLED_SYRINGE | INTRAVENOUS | Status: DC | PRN
  Filled 2017-06-21: qty 10
  Filled 2017-06-21: qty 5

## 2017-06-21 MED ORDER — PENICILLIN G POTASSIUM 5000000 UNITS IJ SOLR
5.0000 10*6.[IU] | Freq: Once | INTRAVENOUS | Status: AC
  Administered 2017-06-21: 5 10*6.[IU] via INTRAVENOUS
  Filled 2017-06-21: qty 5

## 2017-06-21 MED ORDER — DIPHENHYDRAMINE HCL 50 MG/ML IJ SOLN: 12.5000 mg | INTRAMUSCULAR | Status: DC | PRN

## 2017-06-21 MED ORDER — DIBUCAINE 1 % RE OINT: 1.0000 "application " | TOPICAL_OINTMENT | RECTAL | Status: DC | PRN

## 2017-06-21 MED ORDER — OXYCODONE HCL 5 MG PO TABS: 5.0000 mg | ORAL_TABLET | ORAL | Status: DC | PRN

## 2017-06-21 MED ORDER — MAGNESIUM HYDROXIDE 400 MG/5ML PO SUSP: 30.0000 mL | ORAL | Status: DC | PRN

## 2017-06-21 MED ORDER — ACETAMINOPHEN 325 MG PO TABS: 650.0000 mg | ORAL_TABLET | ORAL | Status: DC | PRN

## 2017-06-21 MED ORDER — TERBUTALINE SULFATE 1 MG/ML IJ SOLN
0.2500 mg | Freq: Once | INTRAMUSCULAR | Status: DC | PRN
  Filled 2017-06-21: qty 1

## 2017-06-21 MED ORDER — FENTANYL 2.5 MCG/ML BUPIVACAINE 1/10 % EPIDURAL INFUSION (WH - ANES)
14.0000 mL/h | INTRAMUSCULAR | Status: DC | PRN
  Administered 2017-06-21: 14 mL/h via EPIDURAL
  Filled 2017-06-21: qty 100

## 2017-06-21 MED ORDER — SENNOSIDES-DOCUSATE SODIUM 8.6-50 MG PO TABS
2.0000 | ORAL_TABLET | ORAL | Status: DC
  Administered 2017-06-21: 2 via ORAL
  Filled 2017-06-21: qty 2

## 2017-06-21 MED ORDER — LACTATED RINGERS IV SOLN
500.0000 mL | Freq: Once | INTRAVENOUS | Status: AC
  Administered 2017-06-21: 500 mL via INTRAVENOUS

## 2017-06-21 MED ORDER — ZOLPIDEM TARTRATE 5 MG PO TABS: 5.0000 mg | ORAL_TABLET | Freq: Every evening | ORAL | Status: DC | PRN

## 2017-06-21 MED ORDER — SOD CITRATE-CITRIC ACID 500-334 MG/5ML PO SOLN
30.0000 mL | ORAL | Status: DC | PRN
  Administered 2017-06-21: 30 mL via ORAL
  Filled 2017-06-21: qty 15

## 2017-06-21 MED ORDER — OXYTOCIN BOLUS FROM INFUSION
500.0000 mL | Freq: Once | INTRAVENOUS | Status: AC
  Administered 2017-06-21: 500 mL via INTRAVENOUS

## 2017-06-21 MED ORDER — EPHEDRINE 5 MG/ML INJ
10.0000 mg | INTRAVENOUS | Status: DC | PRN
  Filled 2017-06-21: qty 2

## 2017-06-21 MED ORDER — OXYCODONE-ACETAMINOPHEN 5-325 MG PO TABS: 1.0000 | ORAL_TABLET | ORAL | Status: DC | PRN

## 2017-06-21 MED ORDER — IBUPROFEN 600 MG PO TABS
600.0000 mg | ORAL_TABLET | Freq: Four times a day (QID) | ORAL | Status: DC
  Administered 2017-06-21 – 2017-06-22 (×4): 600 mg via ORAL
  Filled 2017-06-21 (×4): qty 1

## 2017-06-21 MED ORDER — OXYTOCIN 40 UNITS IN LACTATED RINGERS INFUSION - SIMPLE MED
1.0000 m[IU]/min | INTRAVENOUS | Status: DC
  Administered 2017-06-21: 2 m[IU]/min via INTRAVENOUS
  Filled 2017-06-21: qty 1000

## 2017-06-21 MED ORDER — PRENATAL MULTIVITAMIN CH
1.0000 | ORAL_TABLET | Freq: Every day | ORAL | Status: DC
  Administered 2017-06-22: 1 via ORAL
  Filled 2017-06-21: qty 1

## 2017-06-21 MED ORDER — TETANUS-DIPHTH-ACELL PERTUSSIS 5-2.5-18.5 LF-MCG/0.5 IM SUSP: 0.5000 mL | Freq: Once | INTRAMUSCULAR | Status: DC

## 2017-06-21 MED ORDER — ONDANSETRON HCL 4 MG/2ML IJ SOLN: 4.0000 mg | INTRAMUSCULAR | Status: DC | PRN

## 2017-06-21 MED ORDER — LACTATED RINGERS IV SOLN
INTRAVENOUS | Status: DC
  Administered 2017-06-21: 09:00:00 via INTRAVENOUS

## 2017-06-21 MED ORDER — PENICILLIN G POT IN DEXTROSE 60000 UNIT/ML IV SOLN
3.0000 10*6.[IU] | INTRAVENOUS | Status: DC
  Administered 2017-06-21: 3 10*6.[IU] via INTRAVENOUS
  Filled 2017-06-21 (×4): qty 50

## 2017-06-21 MED ORDER — METHYLERGONOVINE MALEATE 0.2 MG/ML IJ SOLN: 0.2000 mg | INTRAMUSCULAR | Status: DC | PRN

## 2017-06-21 MED ORDER — OXYTOCIN 40 UNITS IN LACTATED RINGERS INFUSION - SIMPLE MED
2.5000 [IU]/h | INTRAVENOUS | Status: DC
  Administered 2017-06-21: 2.5 [IU]/h via INTRAVENOUS

## 2017-06-21 MED ORDER — METHYLERGONOVINE MALEATE 0.2 MG PO TABS: 0.2000 mg | ORAL_TABLET | ORAL | Status: DC | PRN

## 2017-06-21 NOTE — Lactation Note (Signed)
This note was copied from a baby's chart. Lactation Consultation Note  Patient Name: Emma Walsh ZOXWR'UToday's Date: 06/21/2017 Reason for consult: Initial assessment Baby at 8 hr of life. Upon entry baby was latched in cradle to L breast. Mom reports baby "stuggles for a few minutes to latch but when he gets it, we are good". She denies breast or nipple pain, voiced no concerns. Discussed baby behavior, feeding frequency, baby belly size, voids, wt loss, breast changes, and nipple care. Mom stated she can manually express, has spoon at the bedside. Given lactation handouts. Aware of OP services and support group.    Maternal Data Has patient been taught Hand Expression?: Yes Does the patient have breastfeeding experience prior to this delivery?: Yes  Feeding Feeding Type: Breast Fed Length of feed: 15 min  LATCH Score Latch: Repeated attempts needed to sustain latch, nipple held in mouth throughout feeding, stimulation needed to elicit sucking reflex.(per mom)  Audible Swallowing: A few with stimulation  Type of Nipple: Everted at rest and after stimulation  Comfort (Breast/Nipple): Soft / non-tender  Hold (Positioning): No assistance needed to correctly position infant at breast.(per mom)  LATCH Score: 8  Interventions Interventions: Breast feeding basics reviewed;Skin to skin;Breast massage;Hand express;Breast compression;Support pillows;Position options;DEBP  Lactation Tools Discussed/Used WIC Program: No   Consult Status Consult Status: Follow-up Date: 06/22/17 Follow-up type: In-patient    [name] 06/21/2017, 9:56 PM

## 2017-06-21 NOTE — Progress Notes (Signed)
Feeling ctx, ready to get epidural Afeb, VSS FHT- Cat II, 120-130, mod variability, + accels, some variable decels VE-5/80/-1, vtx, AROM clear Continue pitocin and PCN, anticipate SVD

## 2017-06-21 NOTE — Anesthesia Postprocedure Evaluation (Signed)
Anesthesia Post Note  Patient: Emma Walsh  Procedure(s) Performed: AN AD HOC LABOR EPIDURAL     Patient location during evaluation: Mother Baby Anesthesia Type: Epidural Level of consciousness: awake and alert and oriented Pain management: satisfactory to patient Vital Signs Assessment: post-procedure vital signs reviewed and stable Respiratory status: respiratory function stable Cardiovascular status: stable Postop Assessment: no headache, no backache, epidural receding, patient able to bend at knees, no signs of nausea or vomiting and adequate PO intake Anesthetic complications: no    Last Vitals:  Vitals:   06/21/17 1530 06/21/17 1630  BP: 125/62 (!) 114/59  Pulse: 80 84  Resp: 18 18  Temp: 36.8 C 37.1 C  SpO2:      Last Pain:  Vitals:   06/21/17 1711  TempSrc:   PainSc: 1    Pain Goal:                 Jeremiah Curci

## 2017-06-21 NOTE — Anesthesia Procedure Notes (Signed)
Epidural Patient location during procedure: OB Start time: 06/21/2017 1:04 PM End time: 06/21/2017 1:08 PM  Staffing Anesthesiologist: Leilani AbleHatchett, Lakayla Barrington, MD Performed: anesthesiologist   Preanesthetic Checklist Completed: patient identified, site marked, surgical consent, pre-op evaluation, timeout performed, IV checked, risks and benefits discussed and monitors and equipment checked  Epidural Patient position: sitting Prep: site prepped and draped and DuraPrep Patient monitoring: continuous pulse ox and blood pressure Approach: midline Location: L3-L4 Injection technique: LOR air  Needle:  Needle type: Tuohy  Needle gauge: 17 G Needle length: 9 cm and 9 Needle insertion depth: 6 cm Catheter type: closed end flexible Catheter size: 19 Gauge Catheter at skin depth: 11 cm Test dose: negative and Other  Assessment Sensory level: T9 Events: blood not aspirated, injection not painful, no injection resistance, negative IV test and no paresthesia  Additional Notes Reason for block:procedure for pain

## 2017-06-21 NOTE — Anesthesia Preprocedure Evaluation (Signed)
Anesthesia Evaluation  Patient identified by MRN, date of birth, ID band Patient awake    Reviewed: Allergy & Precautions, H&P , NPO status , Patient's Chart, lab work & pertinent test results  Airway Mallampati: I  TM Distance: >3 FB Neck ROM: full    Dental no notable dental hx.    Pulmonary    Pulmonary exam normal breath sounds clear to auscultation       Cardiovascular negative cardio ROS Normal cardiovascular exam Rhythm:regular Rate:Normal     Neuro/Psych negative neurological ROS  negative psych ROS   GI/Hepatic negative GI ROS, Neg liver ROS,   Endo/Other  negative endocrine ROS  Renal/GU negative Renal ROS     Musculoskeletal   Abdominal Normal abdominal exam  (+)   Peds  Hematology negative hematology ROS (+)   Anesthesia Other Findings   Reproductive/Obstetrics (+) Pregnancy                             Anesthesia Physical Anesthesia Plan  ASA: II  Anesthesia Plan: Epidural   Post-op Pain Management:    Induction:   PONV Risk Score and Plan:   Airway Management Planned:   Additional Equipment:   Intra-op Plan:   Post-operative Plan:   Informed Consent: I have reviewed the patients History and Physical, chart, labs and discussed the procedure including the risks, benefits and alternatives for the proposed anesthesia with the patient or authorized representative who has indicated his/her understanding and acceptance.     Plan Discussed with:   Anesthesia Plan Comments:         Anesthesia Quick Evaluation

## 2017-06-21 NOTE — H&P (Signed)
Emma Walsh is a 26 y.o. female, G5 [id number], [facility]red[facility] for [facility] induction with favorable cervix.  Late prenatal care at 14-15 weeks, otherwise uncomplicated.  OB History    Gravida Para Term Preterm AB Living   5 2 2   2 2    SAB TAB Ectopic Multiple Live Births     2     2    SVD at term x 2, no complications  Past Medical History:  Diagnosis Date  . Anxiety    2016, When grandmother passed  . Asthma    Past Surgical History:  Procedure Laterality Date  . DILATION AND CURETTAGE OF UTERUS     Family History: family history includes Heart disease in her paternal grandmother. Social History:  reports that  has never smoked. she has never used smokeless tobacco. She reports that she drinks alcohol. She reports that she does not use drugs.     Maternal Diabetes: No Genetic Screening: Declined Maternal Ultrasounds/Referrals: Normal Fetal Ultrasounds or other Referrals:  None Maternal Substance Abuse:  No Significant Maternal Medications:  None Significant Maternal Lab Results:  Lab values include: Group B Strep positive Other Comments:  None  Review of Systems  Respiratory: Negative.   Cardiovascular: Negative.    Maternal Medical History:  Fetal activity: Perceived fetal activity is normal.    Prenatal complications: no prenatal complications Prenatal Complications - Diabetes: none.      Blood pressure 123/78, pulse 95, temperature 98.7 F (37.1 C), temperature source Oral, resp. rate 16, height 5\' 8"  (1.727 m), weight 224 lb (101.6 kg), last menstrual period 09/14/2016. Maternal Exam:  Uterine Assessment: Contraction strength is mild.  Contraction frequency is irregular.   Abdomen: Patient reports no abdominal tenderness. Estimated fetal weight is 7 1/2 lbs.   Fetal presentation: vertex  Introitus: Normal vulva. Normal vagina.  Amniotic fluid character: not assessed.  Pelvis: adequate for delivery.   Cervix: Cervix evaluated by  digital exam.     Fetal Exam Fetal Monitor Review: Mode: ultrasound.   Baseline rate: 120.  Variability: moderate (6-25 bpm).   Pattern: accelerations present and no decelerations.    Fetal State Assessment: Category I - tracings are normal.     Physical Exam  Vitals reviewed. Constitutional: She appears well-developed and well-nourished.  Cardiovascular: Normal rate and regular rhythm.  Respiratory: Effort normal. No respiratory distress.  GI: Soft.    Prenatal labs: ABO, Rh: O/Positive/-- (05/29 0000) Antibody: Negative (05/29 0000) Rubella: Immune (05/29 0000) RPR: Nonreactive (05/29 0000)  HBsAg: Negative (05/29 0000)  HIV: Non-reactive (05/29 0000)  GBS: Positive (10/25 0000)   Assessment/Plan: IUP at 40 weeks for elective induction, +GBS.  Will start pitocin and PCN, monitor progress, anticipate SVD.  Risks of pitocin previously discussed.   Leighton Roachodd D Talajah Slimp 06/21/2017, 8:39 AM

## 2017-06-22 LAB — RPR: RPR: NONREACTIVE

## 2017-06-22 MED ORDER — IBUPROFEN 600 MG PO TABS: 600.0000 mg | ORAL_TABLET | Freq: Four times a day (QID) | ORAL | 1 refills | Status: AC | PRN

## 2017-06-22 MED ORDER — PRENATAL MULTIVITAMIN CH: 1.0000 | ORAL_TABLET | Freq: Every day | ORAL | 3 refills | Status: AC

## 2017-06-22 NOTE — Progress Notes (Addendum)
Post Partum Day 1 Subjective: no complaints, up ad lib, voiding, tolerating PO and nl lochia, pain controlled  Objective: Blood pressure 123/83, pulse 83, temperature 98.2 F (36.8 C), temperature source Oral, resp. rate 16, height 5\' 8"  (1.727 m), weight 101.6 kg (224 lb), last menstrual period 09/14/2016, SpO2 99 %, unknown if currently breastfeeding.  Physical Exam:  General: alert and no distress Lochia: appropriate Uterine Fundus: firm  Recent Labs    06/21/17 0800  HGB 11.7*  HCT 35.7*    Assessment/Plan: Plan for discharge tomorrow, Breastfeeding and Lactation consult.  Routine PP care.  Pt desires d/c at 24 hours.  D/c with motrin and pnv, f/u 6 weeks   LOS: 1 day   Freeda Spivey Bovard-Stuckert 06/22/2017, 8:21 AM

## 2017-06-22 NOTE — Progress Notes (Signed)
MOB was referred for history of depression/anxiety. * Referral screened out by Clinical Social Worker because none of the following criteria appear to apply: ~ History of anxiety/depression during this pregnancy, or of post-partum depression. ~ Diagnosis of anxiety and/or depression within last 3 years OR * MOB's symptoms currently being treated with medication and/or therapy.  *MOB experienced anxiety after the loss of MOB's grandmother (which is common after the death of a loved one).  No concerns noted in OB records.  Please contact the Clinical Social Worker if needs arise, by MOB request, or if MOB scores greater than 9/yes to question 10 on Edinburgh Postpartum Depression Screen.  Alainah Phang Boyd-Gilyard, MSW, LCSW Clinical Social Work [phone] 

## 2017-06-22 NOTE — Discharge Summary (Signed)
OB Discharge Summary     Patient Name: Emma Walsh DOB: 1990-10-31 MRN: [id number]  Date of admission: 06/21/2017 Delivering MD: Jackelyn KnifeMEISINGER, TODD   Date of discharge: 06/22/2017  Admitting diagnosis: INDUCTION Intrauterine pregnancy: 571w0d     Secondary diagnosis:  Active Problems:   Indication for care in labor or delivery   SVD (spontaneous vaginal delivery)  Additional problems: N/A     Discharge diagnosis: Term Pregnancy Delivered                                                                                                Post partum procedures:N/A  Augmentation: Pitocin  Complications: None  Hospital course:  Induction of Labor With Vaginal Delivery   26 y.o. yo [id number] at 301w0d was admitted to the hospital 06/21/2017 for induction of labor.  Indication for induction: Favorable cervix at term.  Patient had an uncomplicated labor course as follows: Membrane Rupture Time/Date: 12:10 PM ,06/21/2017   Intrapartum Procedures: Episiotomy: None [1]                                         Lacerations:  Labial [10]  Patient had delivery of a Viable infant.  Information for the patient's newborn:  Domingo PulseHunter, Boy Mahati [[id number]][[id number]]  Delivery Method: Vaginal, Spontaneous(Filed from Delivery Summary)   06/21/2017  Details of delivery can be found in separate delivery note.  Patient had a routine postpartum course. Patient is discharged home 06/22/17.  Physical exam  Vitals:   06/21/17 1530 06/21/17 1630 06/21/17 2044 06/22/17 0500  BP: 125/62 (!) 114/59 (!) 122/59 123/83  Pulse: 80 84 80 83  Resp: 18 18 16 16   Temp: 98.2 F (36.8 C) 98.8 F (37.1 C) 98.1 F (36.7 C) 98.2 F (36.8 C)  TempSrc: Oral Oral Oral Oral  SpO2:    99%  Weight:      Height:       General: alert and no distress Lochia: appropriate Uterine Fundus: firm  Labs: Lab Results  Component Value Date   WBC 10.4 06/21/2017   HGB 11.7 (L) 06/21/2017   HCT 35.7 (L) 06/21/2017   MCV 92.7 06/21/2017    PLT 201 06/21/2017   No flowsheet data found.  Discharge instruction: per After Visit Summary and "Baby and Me Booklet".  After visit meds:  Allergies as of 06/22/2017   No Known Allergies     Medication List    TAKE these medications   ibuprofen 600 MG tablet Commonly known as:  ADVIL,MOTRIN Take 1 tablet (600 mg total) by mouth every 6 (six) hours as needed.   OVER THE COUNTER MEDICATION Take 1-2 tablets by mouth 2 (two) times daily as needed (heartburn). calmicid   prenatal multivitamin Tabs tablet Take 1 tablet by mouth daily at 12 noon.       Diet: routine diet  Activity: Advance as tolerated. Pelvic rest for 6 weeks.   Outpatient follow up:6 weeks Follow up Appt:No future appointments. Follow up Visit:No Follow-up on file.  Postpartum contraception: Undecided  Newborn Data: Live born female  Birth Weight: 7 lb 13.9 oz (3570 g) APGAR: 8, 9  Newborn Delivery   Birth date/time:  06/21/2017 13:32:00 Delivery type:  Vaginal, Spontaneous     Baby Feeding: Breast Disposition:home with mother   06/22/2017 Sherian ReinJody Bovard-Stuckert, MD

## 2017-07-05 ENCOUNTER — Emergency Department (HOSPITAL_COMMUNITY): Payer: Medicaid Other

## 2017-07-05 ENCOUNTER — Emergency Department (HOSPITAL_COMMUNITY)
Admission: EM | Admit: 2017-07-05 | Discharge: 2017-07-05 | Disposition: A | Discharge status: Home / Self Care | Payer: Medicaid Other | Attending: Emergency Medicine | Admitting: Emergency Medicine

## 2017-07-05 ENCOUNTER — Encounter (HOSPITAL_COMMUNITY): Payer: Self-pay

## 2017-07-05 DIAGNOSIS — J45909 Unspecified asthma, uncomplicated: Secondary | ICD-10-CM | POA: Insufficient documentation

## 2017-07-05 DIAGNOSIS — F419 Anxiety disorder, unspecified: Secondary | ICD-10-CM | POA: Insufficient documentation

## 2017-07-05 DIAGNOSIS — R002 Palpitations: Secondary | ICD-10-CM | POA: Diagnosis present

## 2017-07-05 LAB — BASIC METABOLIC PANEL
Anion gap: 9 (ref 5–15)
BUN: 13 mg/dL (ref 6–20)
CALCIUM: 9.3 mg/dL (ref 8.9–10.3)
CHLORIDE: 106 mmol/L (ref 101–111)
CO2: 26 mmol/L (ref 22–32)
CREATININE: 1 mg/dL (ref 0.44–1.00)
GFR calc non Af Amer: >60 mL/min (ref >60)
GLUCOSE: 102 mg/dL — AB (ref 65–99)
Potassium: 4.2 mmol/L (ref 3.5–5.1)
Sodium: 141 mmol/L (ref 135–145)

## 2017-07-05 LAB — I-STAT TROPONIN, ED: TROPONIN I, POC: 0.02 ng/mL (ref 0.00–0.08)

## 2017-07-05 LAB — CBC
HCT: 43.2 % (ref 36.0–46.0)
Hemoglobin: 14 g/dL (ref 12.0–15.0)
MCH: 29.5 pg (ref 26.0–34.0)
MCHC: 32.4 g/dL (ref 30.0–36.0)
MCV: 90.9 fL (ref 78.0–100.0)
PLATELETS: 366 10*3/uL (ref 150–400)
RBC: 4.75 MIL/uL (ref 3.87–5.11)
RDW: 13.3 % (ref 11.5–15.5)
WBC: 9.4 10*3/uL (ref 4.0–10.5)

## 2017-07-05 NOTE — ED Notes (Signed)
Waiting

## 2017-07-05 NOTE — ED Provider Notes (Signed)
[facility]   CSN: [id number] Arrival date & time: 07/05/17  1049     History   Chief Complaint Chief Complaint  Patient presents with  . Palpitations    HPI Emma Walsh is a 26 y.o. female.  HPI   26 year old female presents today with complaints of palpitations.  Patient notes that 4 days ago she had an episode of racing heart.  She notes this lasted approximately 12 minutes and then resolved prior to EMS evaluation.  She was evaluated at that time but did not feel the need to come to the emergency room.  She reports that she has had several episodes since then not as severe, with no associated nausea vomiting chest pain shortness of breath.  Patient notes she does get slightly dizzy when the symptoms come on, but reports she is able to manage the symptoms with trying to relax.  Patient denies any history of the same.  Patient is status post normal vaginal delivery 2 weeks ago, she is breast-feeding, she denies drug intake, caffeine, or any other medications.  No lower extremity swelling.    Past Medical History:  Diagnosis Date  . Anxiety    2016, When grandmother passed  . Asthma     Patient Active Problem List   Diagnosis Date Noted  . Indication for care in labor or delivery 06/21/2017  . SVD (spontaneous vaginal delivery) 06/21/2017    Past Surgical History:  Procedure Laterality Date  . DILATION AND CURETTAGE OF UTERUS      OB History    Gravida Para Term Preterm AB Living   5 3 3   2 3    SAB TAB Ectopic Multiple Live Births     2   0 3       Home Medications    Prior to Admission medications   Medication Sig Start Date End Date Taking? Authorizing Provider  ibuprofen (ADVIL,MOTRIN) 600 MG tablet Take 1 tablet (600 mg total) by mouth every 6 (six) hours as needed. 06/22/17   Bovard-Stuckert, Augusto GambleJody, MD  OVER THE COUNTER MEDICATION Take 1-2 tablets by mouth 2 (two) times daily as needed (heartburn).  calmicid    [provider]  Prenatal Vit-Fe Fumarate-FA (PRENATAL MULTIVITAMIN) TABS tablet Take 1 tablet by mouth daily at 12 noon. 06/22/17   Bovard-StuckertAugusto Gamble, Jody, MD    Family History Family History  Problem Relation Age of Onset  . Heart disease Paternal Grandmother     Social History Social History   Tobacco Use  . Smoking status: Never Smoker  . Smokeless tobacco: Never Used  Substance Use Topics  . Alcohol use: Yes    Comment: social  . Drug use: No     Allergies   Patient has no known allergies.   Review of Systems Review of Systems  All other systems reviewed and are negative.    Physical Exam Updated Vital Signs BP 117/77   Pulse 80   Temp 97.9 F (36.6 C) (Oral)   Resp 20   SpO2 100%   Physical Exam  Constitutional: She is oriented to person, place, and time. She appears well-developed and well-nourished.  HENT:  Head: Normocephalic and atraumatic.  Eyes: Conjunctivae are normal. Pupils are equal, round, and reactive to light. Right eye exhibits no discharge. Left eye exhibits no discharge. No scleral icterus.  Neck: Normal range of motion. No JVD present. No tracheal deviation present.  Cardiovascular: Normal rate, regular rhythm, normal heart sounds and  intact distal pulses. Exam reveals no gallop and no friction rub.  No murmur heard. Pulmonary/Chest: Effort normal and breath sounds normal. No stridor. No respiratory distress. She has no wheezes. She has no rales. She exhibits no tenderness.  Neurological: She is alert and oriented to person, place, and time. Coordination normal.  Psychiatric: She has a normal mood and affect. Her behavior is normal. Judgment and thought content normal.  Nursing note and vitals reviewed.    ED Treatments / Results  Labs (all labs ordered are listed, but only abnormal results are displayed) Labs Reviewed  BASIC METABOLIC PANEL - Abnormal; Notable for the following components:      Result Value    Glucose, Bld 102 (*)    All other components within normal limits  CBC  I-STAT TROPONIN, ED    EKG  EKG Interpretation None      ED ECG REPORT   Date: 07/05/2017 Normal sinus rhythm no signs of ischemia  Dg Chest 2 View  Result Date: 07/05/2017 CLINICAL DATA:  Episodes of tachycardia for the past 4 days. Recent pregnancy and delivery. EXAM: CHEST  2 VIEW COMPARISON:  Chest x-ray of May 14, 2016 FINDINGS: The lungs are adequately inflated and clear. The heart and pulmonary vascularity are normal. The mediastinum is normal in width. There is no pleural effusion. The bony thorax is unremarkable. IMPRESSION: There is no active cardiopulmonary disease. Electronically Signed   By: David  SwazilandJordan M.D.   On: 07/05/2017 11:36    Procedures Procedures (including critical care time)  Medications Ordered in ED Medications - No data to display   Initial Impression / Assessment and Plan / ED Course  I have reviewed the triage vital signs and the nursing notes.  Pertinent labs & imaging results that were available during my care of the patient were reviewed by me and considered in my medical decision making (see chart for details).      Final Clinical Impressions(s) / ED Diagnoses   Final diagnoses:  Palpitations    Labs: I-STAT troponin, BMP, CBC  Imaging: DG Chest 2 View  Consults:  Therapeutics:  Discharge Meds:   Assessment/Plan: 26 year old female presents today with complaints of palpitations.  Patient had an episode the last approximately 12 minutes, this does sound like palpitations, could be abnormal rhythm including SVT.  Patient reports she is able to control the symptoms.  Patient is asymptomatic through her entire stay here, she has reassuring workup here.  Patient has no signs of DVT PE, low suspicion for structural cardiac disease.  Patient is stable for outpatient cardiology follow-up with strict return precautions.  She verbalized understanding and agreement  to today's plan had no further questions or concerns.      ED Discharge Orders    None       Rosalio LoudHedges, Nashae Maudlin, PA-C 07/05/17 2133    Rolan BuccoBelfi, Melanie, MD 07/05/17 2239

## 2017-07-05 NOTE — ED Notes (Signed)
ED Provider at bedside. 

## 2017-07-05 NOTE — Discharge Instructions (Signed)
Please read attached information. If you experience any new or worsening signs or symptoms please return to the emergency room for evaluation. Please follow-up with your primary care provider or specialist as discussed.  °

## 2017-07-05 NOTE — ED Triage Notes (Addendum)
Patient complains of intermittent palpitations and chest heaviness x 3 days. Reports minimal palpitations on assessment. No cold or cough, just gave birth on 11/20. Alert and oriented, NAD

## 2017-07-18 ENCOUNTER — Ambulatory Visit: Payer: Medicaid Other | Admitting: Cardiology

## 2017-07-27 ENCOUNTER — Telehealth: Payer: Self-pay | Admitting: Cardiology

## 2017-07-27 NOTE — Telephone Encounter (Signed)
07/27/2017 [facility] with Dr. Antoine PocheHochrein on 08/18/2017

## 2017-07-28 NOTE — Progress Notes (Signed)
Recent Labs: *07/14/2017 Na+ 140, K+ 4.6, Cl- 106, HCO3- 22 , BUN 14, Cr 1.05, Glu 96, Ca2+ 8.7; AST 16, ALT, 23 AlkP 153,   CBC: W 13.3, H/H 13.3/39.3, Plt 346   Bryan Lemmaavid Kamariyah Timberlake, MD

## 2017-08-18 ENCOUNTER — Ambulatory Visit: Payer: Medicaid Other | Admitting: Cardiology

## 2019-06-07 IMAGING — DX DG CHEST 2V
2 series · 2 of 2 positions shown · non-contrast
Comparison: Chest x-ray of May 14, 2016

CLINICAL DATA: Episodes of tachycardia for the past 4 days. Recent
pregnancy and delivery.

EXAM:
CHEST  2 VIEW

[w chest pa]
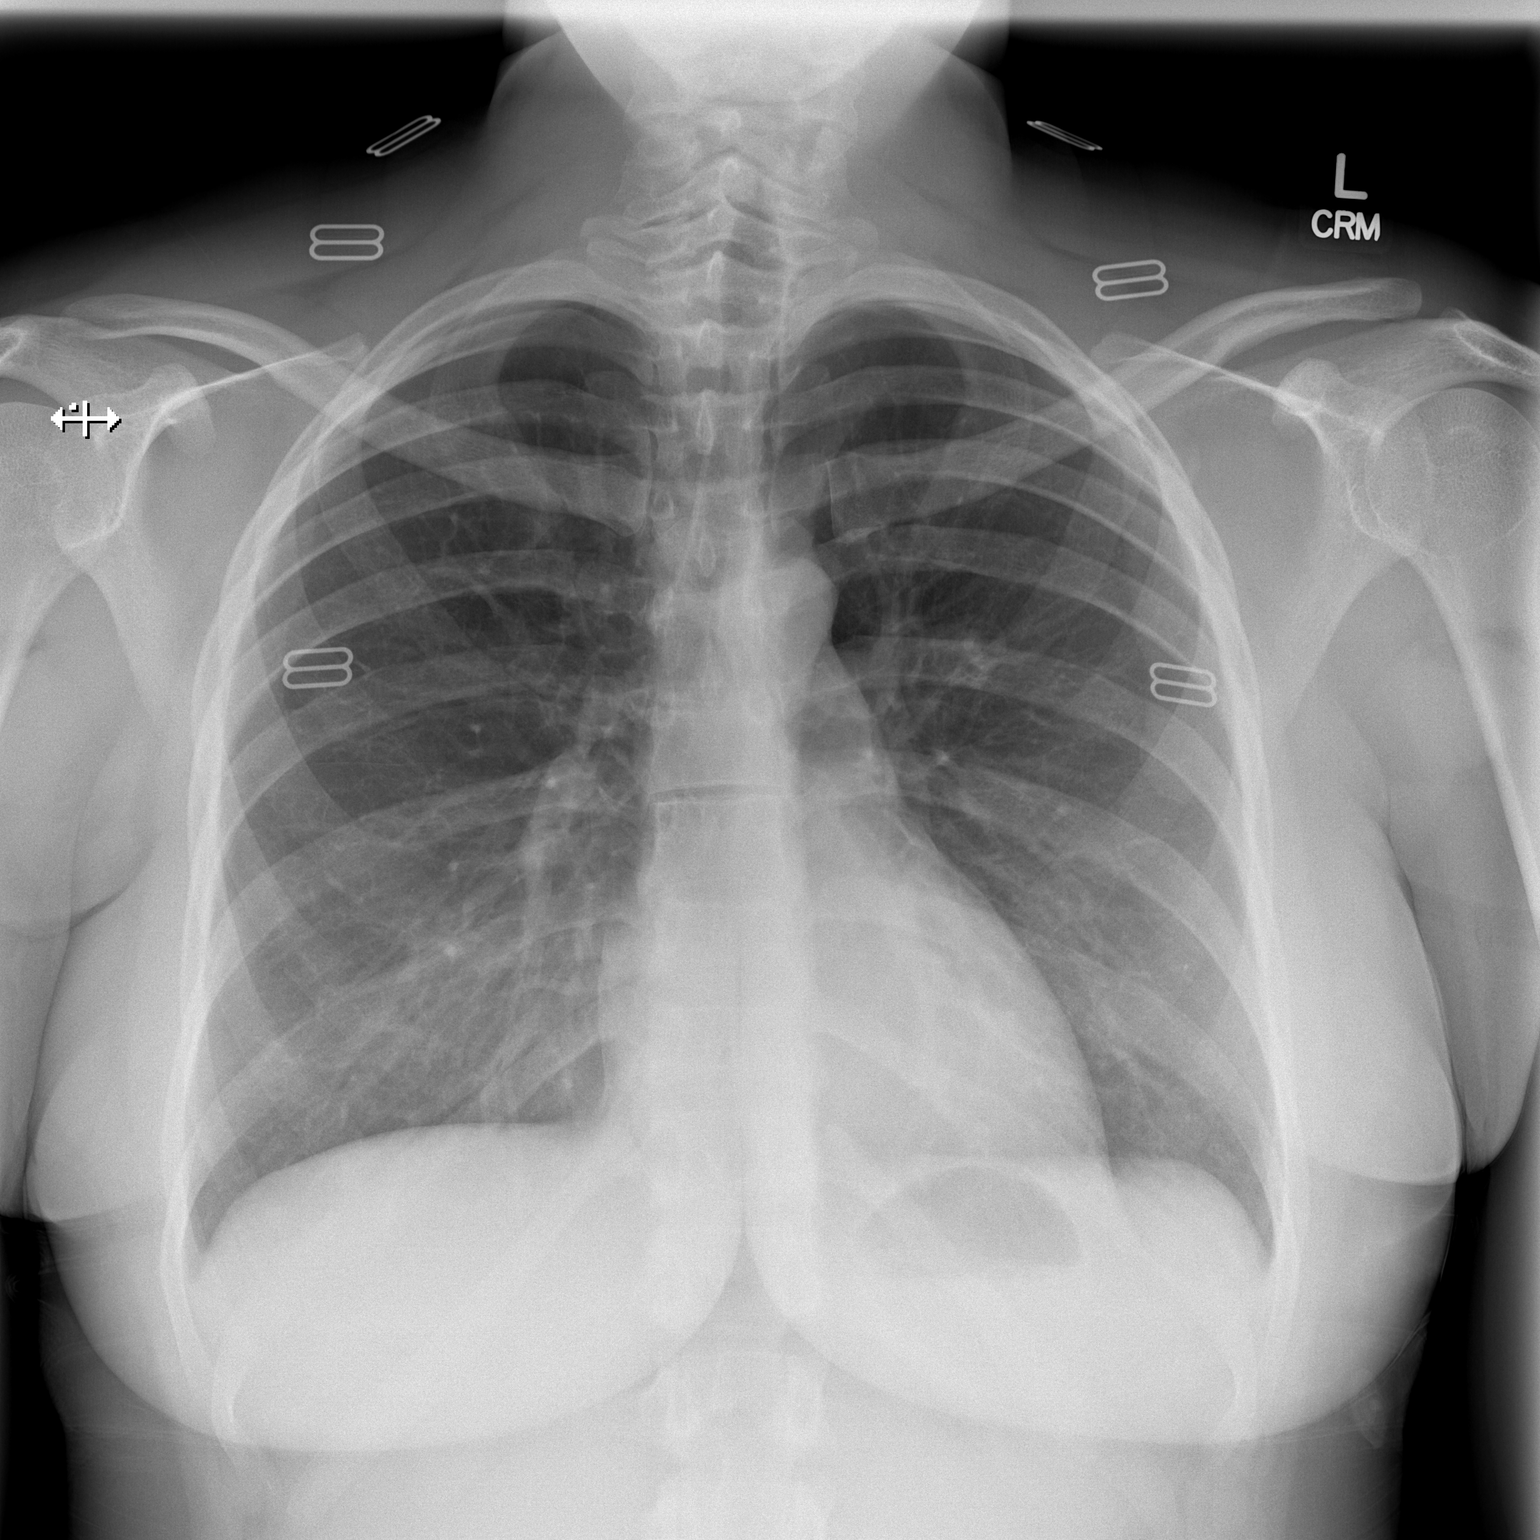

[w chest lat]
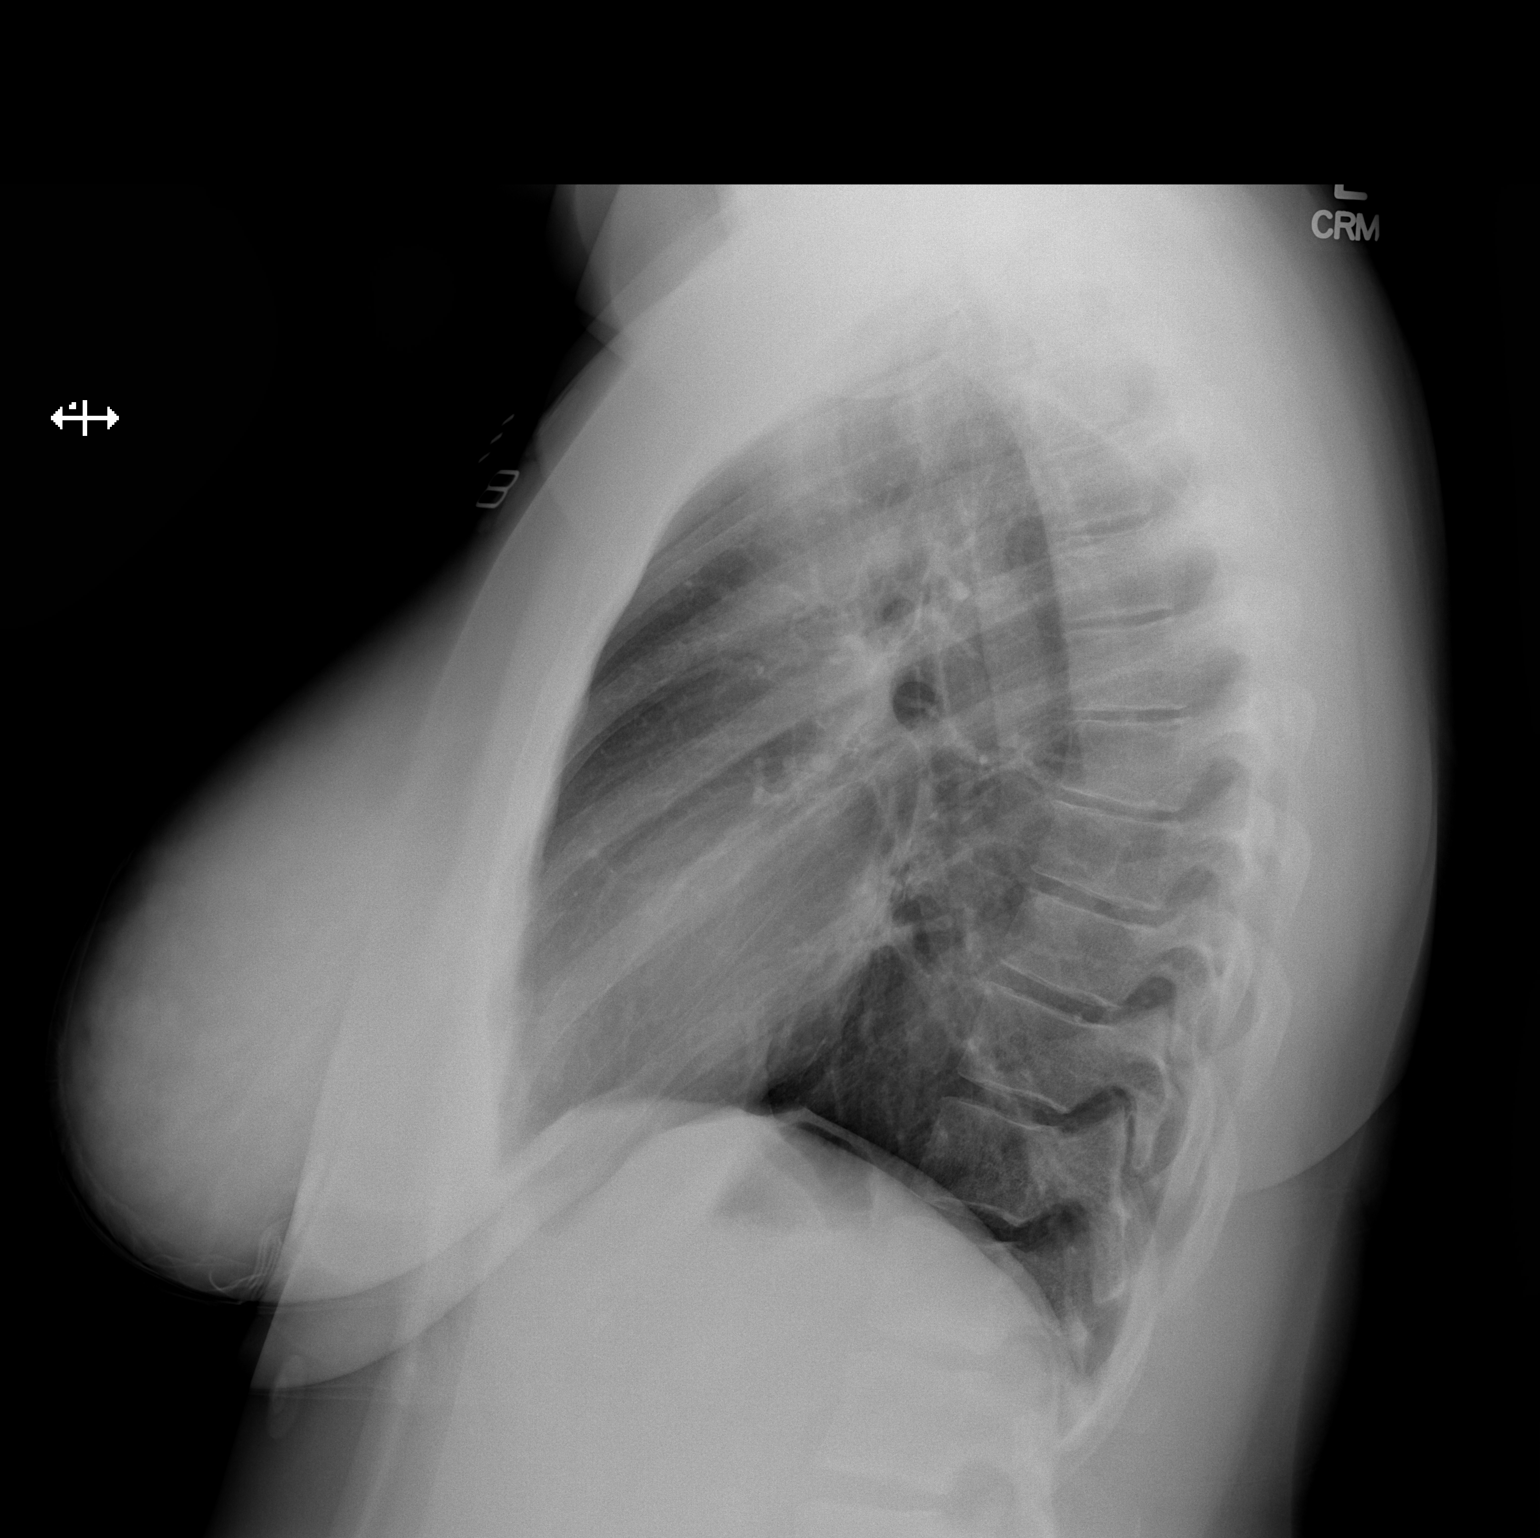

[2 of 2 positions shown; findings below may reference images not displayed]

FINDINGS: The lungs are adequately inflated and clear. The heart and pulmonary
vascularity are normal. The mediastinum is normal in width. There is
no pleural effusion. The bony thorax is unremarkable.
IMPRESSION: There is no active cardiopulmonary disease.
